# Patient Record
Sex: Female | Born: 1940 | Race: White | Hispanic: No | Smoking: Former smoker
Health system: Southern US, Community
[De-identification: ages and names within clinical notes are randomized; demographics above are authoritative.]

## PROBLEM LIST (undated history)

## (undated) DIAGNOSIS — E119 Type 2 diabetes mellitus without complications: Secondary | ICD-10-CM

## (undated) HISTORY — PX: CHOLECYSTECTOMY: SHX55

---

## 2017-11-06 ENCOUNTER — Other Ambulatory Visit: Payer: Self-pay

## 2017-11-06 ENCOUNTER — Emergency Department: Payer: Medicare (Managed Care)

## 2017-11-06 ENCOUNTER — Emergency Department
Admission: EM | Admit: 2017-11-06 | Discharge: 2017-11-06 | Disposition: A | Discharge status: Home / Self Care | Payer: Medicare (Managed Care) | Attending: Emergency Medicine | Admitting: Emergency Medicine

## 2017-11-06 ENCOUNTER — Encounter: Payer: Self-pay | Admitting: Emergency Medicine

## 2017-11-06 DIAGNOSIS — M25552 Pain in left hip: Secondary | ICD-10-CM | POA: Diagnosis not present

## 2017-11-06 DIAGNOSIS — I10 Essential (primary) hypertension: Secondary | ICD-10-CM | POA: Diagnosis not present

## 2017-11-06 DIAGNOSIS — Z87891 Personal history of nicotine dependence: Secondary | ICD-10-CM | POA: Diagnosis not present

## 2017-11-06 DIAGNOSIS — M25562 Pain in left knee: Secondary | ICD-10-CM | POA: Diagnosis not present

## 2017-11-06 DIAGNOSIS — E119 Type 2 diabetes mellitus without complications: Secondary | ICD-10-CM | POA: Insufficient documentation

## 2017-11-06 DIAGNOSIS — R2242 Localized swelling, mass and lump, left lower limb: Secondary | ICD-10-CM | POA: Diagnosis not present

## 2017-11-06 HISTORY — DX: Type 2 diabetes mellitus without complications: E11.9

## 2017-11-06 MED ORDER — KETOROLAC TROMETHAMINE 30 MG/ML IJ SOLN
30.0000 mg | Freq: Once | INTRAMUSCULAR | Status: DC
  Filled 2017-11-06: qty 1

## 2017-11-06 MED ORDER — LIDOCAINE 0.5 % EX GEL: 1.0000 "application " | Freq: Three times a day (TID) | CUTANEOUS | 0 refills | Status: AC

## 2017-11-06 MED ORDER — TRAMADOL HCL 50 MG PO TABS
50.0000 mg | ORAL_TABLET | Freq: Once | ORAL | Status: AC
  Administered 2017-11-06: 50 mg via ORAL
  Filled 2017-11-06: qty 1

## 2017-11-06 MED ORDER — TRAMADOL HCL 50 MG PO TABS: 50.0000 mg | ORAL_TABLET | Freq: Four times a day (QID) | ORAL | 0 refills | Status: AC | PRN

## 2017-11-06 NOTE — ED Notes (Signed)
Patient transported to Ultrasound 

## 2017-11-06 NOTE — ED Triage Notes (Signed)
Pt presents to ED c/o L hip pain shooting down to knee. Denies injury.

## 2017-11-06 NOTE — ED Provider Notes (Signed)
[facility] Emergency Department Provider Note  ____________________________________________  Time seen: Approximately 2:14 PM  I have reviewed the triage vital signs and the nursing notes.   HISTORY  Chief Complaint Hip Pain and Leg Pain    HPI Brianna Roberson is a 77 y.o. female that presents to the emergency department for evaluation of left hip and left knee pain worsening for 1 week. Knee pain is worse than hip. Pain is worse with walking.  No trauma.  Patient had 3-10 hour drives this week.  She drove from [place] to [place] up to [place] and back to [place]. Pain is worse when she is sitting in her daughter's "soft chair." She is taking a significant amount of Tylenol for pain.  She is unable to take NSAIDs due to GI bleeding.  No numbness, tingling.   Past Medical History:  Diagnosis Date  . Diabetes mellitus without complication (HCC)    type II    There are no active problems to display for this patient.   Past Surgical History:  Procedure Laterality Date  . CHOLECYSTECTOMY      Prior to Admission medications   Medication Sig Start Date End Date Taking? Authorizing Provider  Lidocaine 0.5 % GEL Apply 1 application topically 3 (three) times daily. 11/06/17   Enid Derry, PA-C  traMADol (ULTRAM) 50 MG tablet Take 1 tablet (50 mg total) by mouth every 6 (six) hours as needed. 11/06/17 11/06/18  Enid Derry, PA-C    Allergies Patient has no known allergies.  History reviewed. No pertinent family history.  Social History Social History   Tobacco Use  . Smoking status: Former Smoker    Types: Cigarettes    Last attempt to quit: 2009    Years since quitting: 10.3  . Smokeless tobacco: Never Used  Substance Use Topics  . Alcohol use: Yes    Comment: "socially"  . Drug use: Not on file     Review of Systems  Cardiovascular: No chest pain. Respiratory: No SOB. Gastrointestinal: No abdominal pain. No nausea, no  vomiting.  Musculoskeletal: Positive for hip and knee pain. Skin: Negative for rash, abrasions, lacerations, ecchymosis. Neurological: Negative for headaches, numbness or tingling   ____________________________________________   PHYSICAL EXAM:  VITAL SIGNS: ED Triage Vitals  Enc Vitals Group     BP 11/06/17 1356 (!) 194/52     Pulse Rate 11/06/17 1356 67     Resp 11/06/17 1356 18     Temp 11/06/17 1356 97.9 F (36.6 C)     Temp Source 11/06/17 1356 Oral     SpO2 11/06/17 1356 100 %     Weight 11/06/17 1356 186 lb (84.4 kg)     Height 11/06/17 1356  (1.6 m)     Head Circumference --      Peak Flow --      Pain Score 11/06/17 1359 8     Pain Loc --      Pain Edu? --      Excl. in GC? --      Constitutional: Alert and oriented. Well appearing and in no acute distress. Eyes: Conjunctivae are normal. PERRL. EOMI. Head: Atraumatic. ENT:      Ears:      Nose: No congestion/rhinnorhea.      Mouth/Throat: Mucous membranes are moist.  Neck: No stridor.   Cardiovascular: Normal rate, regular rhythm.  Good peripheral circulation. Respiratory: Normal respiratory effort without tachypnea or retractions. Lungs CTAB. Good air entry to  the bases with no decreased or absent breath sounds. Gastrointestinal: Bowel sounds 4 quadrants. Soft and nontender to palpation. No guarding or rigidity. No palpable masses. No distention. Musculoskeletal: Full range of motion to all extremities. No gross deformities appreciated. Antalgic gait. Neurologic:  Normal speech and language. No gross focal neurologic deficits are appreciated.  Skin:  Skin is warm, dry and intact. No rash noted. Psychiatric: Mood and affect are normal. Speech and behavior are normal. Patient exhibits appropriate insight and judgement.   ____________________________________________   LABS (all labs ordered are listed, but only abnormal results are displayed)  Labs Reviewed - No data to  display ____________________________________________  EKG   ____________________________________________  RADIOLOGY Lexine Baton, personally viewed and evaluated these images (plain radiographs) as part of my medical decision making, as well as reviewing the written report by the radiologist.  US Venous Img Lower Unilateral Left  Result Date: 11/06/2017 CLINICAL DATA:  Left lower extremity pain and edema. Recent travel. Evaluate for DVT. EXAM: LEFT LOWER EXTREMITY VENOUS DOPPLER ULTRASOUND TECHNIQUE: Gray-scale sonography with graded compression, as well as color Doppler and duplex ultrasound were performed to evaluate the lower extremity deep venous systems from the level of the common femoral vein and including the common femoral, femoral, profunda femoral, popliteal and calf veins including the posterior tibial, peroneal and gastrocnemius veins when visible. The superficial great saphenous vein was also interrogated. Spectral Doppler was utilized to evaluate flow at rest and with distal augmentation maneuvers in the common femoral, femoral and popliteal veins. COMPARISON:  None. FINDINGS: Contralateral Common Femoral Vein: Respiratory phasicity is normal and symmetric with the symptomatic side. No evidence of thrombus. Normal compressibility. Common Femoral Vein: No evidence of thrombus. Normal compressibility, respiratory phasicity and response to augmentation. Saphenofemoral Junction: No evidence of thrombus. Normal compressibility and flow on color Doppler imaging. Profunda Femoral Vein: No evidence of thrombus. Normal compressibility and flow on color Doppler imaging. Femoral Vein: No evidence of thrombus. Normal compressibility, respiratory phasicity and response to augmentation. Popliteal Vein: No evidence of thrombus. Normal compressibility, respiratory phasicity and response to augmentation. Calf Veins: No evidence of thrombus. Normal compressibility and flow on color Doppler imaging.  Superficial Great Saphenous Vein: No evidence of thrombus. Normal compressibility. Venous Reflux:  None. Other Findings:  None. IMPRESSION: No evidence of DVT within the left lower extremity. Electronically Signed   By: Simonne Come M.D.   On: 11/06/2017 15:28   Dg Knee Complete 4 Views Left  Result Date: 11/06/2017 CLINICAL DATA:  Left hip pain shooting down the knee. EXAM: LEFT KNEE - COMPLETE 4+ VIEW COMPARISON:  None. FINDINGS: No acute fracture or dislocation. No aggressive osseous lesion. 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 No significant joint effusion. Peripheral vascular atherosclerotic disease. Small well corticated area of ossification along the periphery of the medial femoral condyle likely reflecting sequela prior MCL injury. IMPRESSION: No acute osseous injury of the left knee. Electronically Signed   By: Elige Ko   On: 11/06/2017 14:55   Dg Hip Unilat W Or Wo Pelvis 2-3 Views Left  Result Date: 11/06/2017 CLINICAL DATA:  Left hip pain radiating to the knee. No known injury. EXAM: DG HIP (WITH OR WITHOUT PELVIS) 2-3V LEFT COMPARISON:  None. FINDINGS: No fracture or dislocation. Left hip joint spaces appear preserved. No evidence of avascular necrosis. Limited visualization of the pelvis suggests mild degenerate change of the pubic symphysis. Normal appearance of the contralateral right hip. Tiny ossicle  adjacent to the right greater trochanter likely represents the sequela prior avulsive injury. Radiopaque presumed pill fragments overlie the right lower abdominal quadrant and rectum. Vascular calcifications. IMPRESSION: No definite explanation for patient's radiating left hip pain. Electronically Signed   By: Simonne Come M.D.   On: 11/06/2017 14:56    ____________________________________________    PROCEDURES  Procedure(s) performed:    Procedures    Medications  traMADol (ULTRAM) tablet 50 mg (50 mg Oral Given  11/06/17 1656)     ____________________________________________   INITIAL IMPRESSION / ASSESSMENT AND PLAN / ED COURSE  Pertinent labs & imaging results that were available during my care of the patient were reviewed by me and considered in my medical decision making (see chart for details).  Review of the [place] CSRS was performed in accordance of the [facility] prior to dispensing any controlled drugs.   Patient presented to emergency department for evaluation of left hip and knee pain.  Ultrasound negative for DVT.  Hip and knee x-ray negative for acute abnormalities.  All x-ray findings were discussed with patient.  Blood pressure is high in the emergency department.  Pain is likely contributing. She is taking her blood pressure medication as prescribed. She saw her primary care last week and had blood work done on Thursday.  She will see her primary care provider next week when she returns to [place].  She is asymptomatic and denies headache, dizziness, visual changes, chest pain.  Patient will be discharged home with prescriptions for tramadol.  She will ice knee.  Patient is to follow up with primary care as directed. Patient is given ED precautions to return to the ED for any worsening or new symptoms.     ____________________________________________  FINAL CLINICAL IMPRESSION(S) / ED DIAGNOSES  Final diagnoses:  Acute pain of left knee  Hypertension, unspecified type      NEW MEDICATIONS STARTED DURING THIS VISIT:  ED Discharge Orders        Ordered    traMADol (ULTRAM) 50 MG tablet  Every 6 hours PRN     11/06/17 1638    Lidocaine 0.5 % GEL  3 times daily     11/06/17 1638    Walker standard     11/06/17 1638          This chart was dictated using voice recognition software/Dragon. Despite best efforts to proofread, errors can occur which can change the meaning. Any change was purely unintentional.    Enid Derry, PA-C 11/07/17 1648    Arnaldo Natal,  MD 11/08/17 1538

## 2017-11-06 NOTE — Discharge Instructions (Signed)
Your blood pressure was high while in ED.  Please continue blood pressure medication.  Follow-up with primary care as soon as possible.  Return to the emergency department immediately for headache, dizziness, visual changes, chest pain or any other concerning symptoms.

## 2019-06-29 IMAGING — US US EXTREM LOW VENOUS*L*
1 series · 13 of 24 positions shown · non-contrast
Comparison: None.

CLINICAL DATA: Left lower extremity pain and edema. Recent travel.
Evaluate for DVT.



[Series 1: us extrem low venous*left* · 0.08mm/px · 13 of 34 slices shown]
[im 1/34]
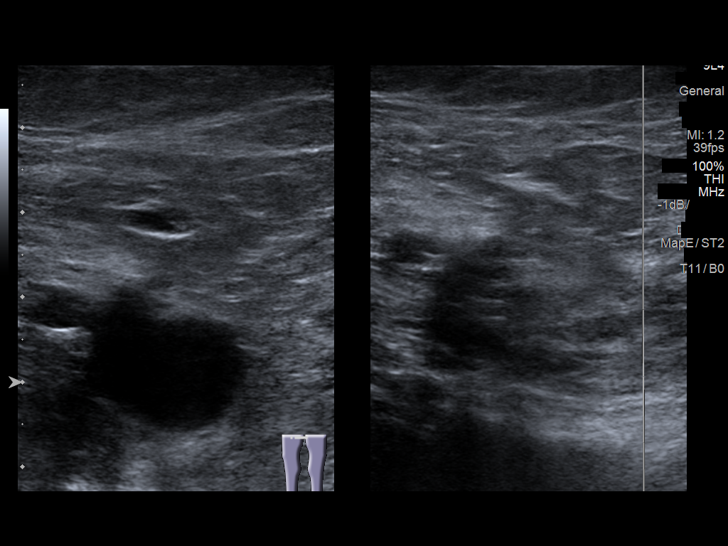
[im 3/34]
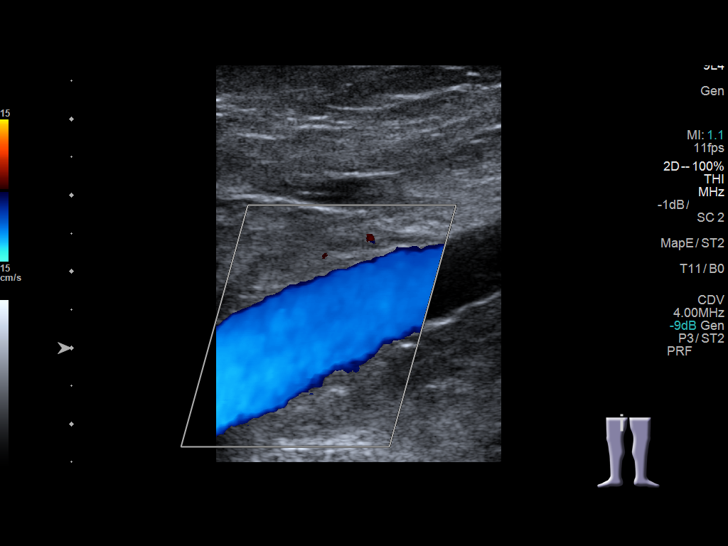
[im 6/34]
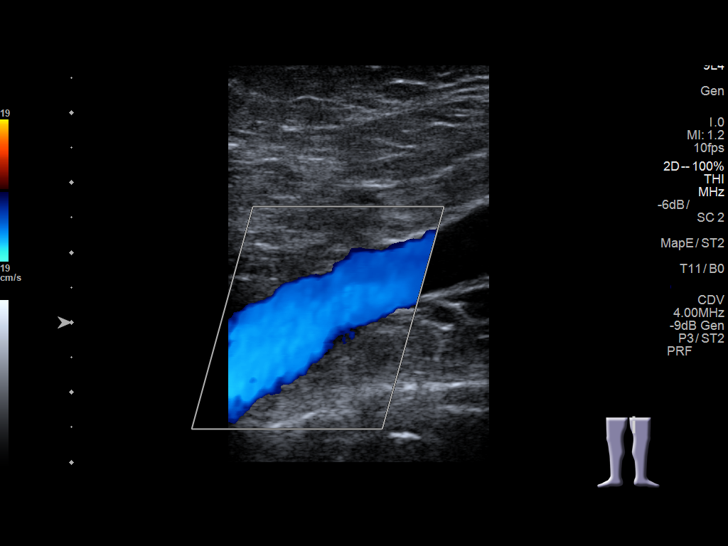
[im 9/34]
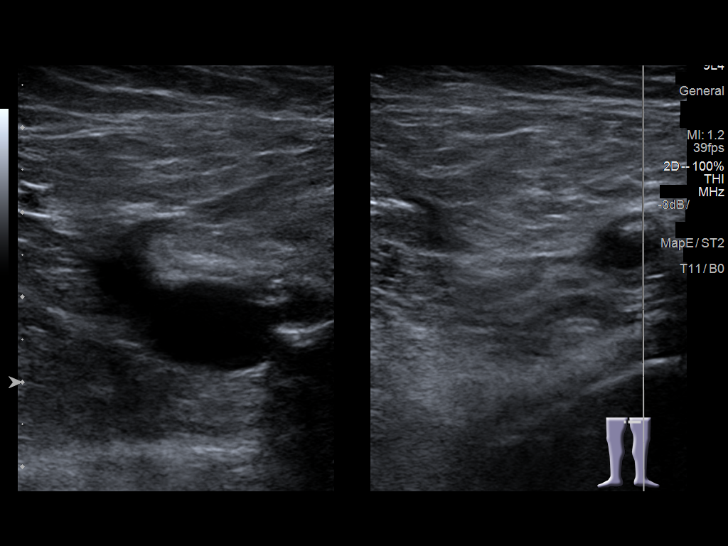
[im 12/34]
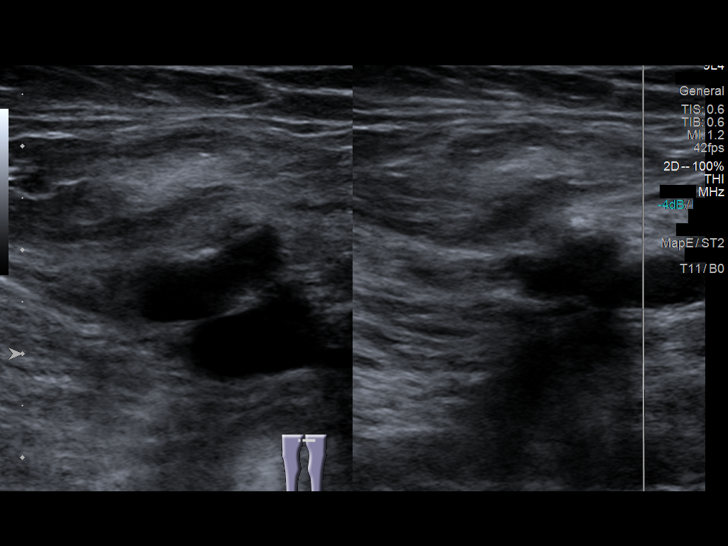
[im 15/34]
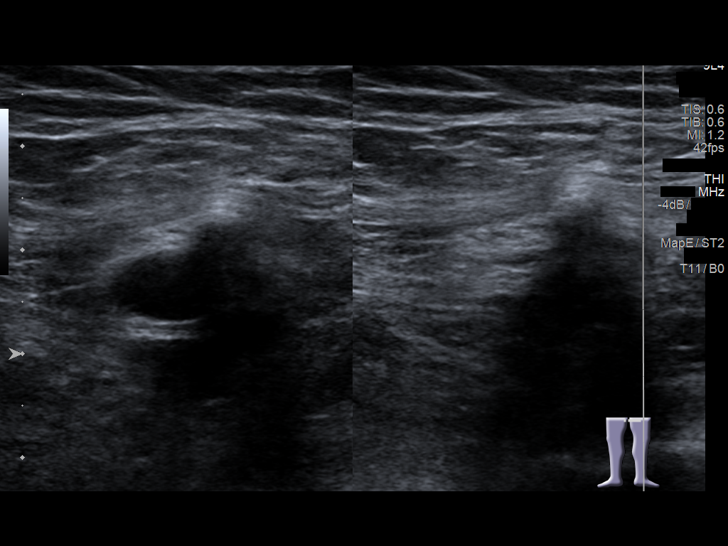
[im 18/34]
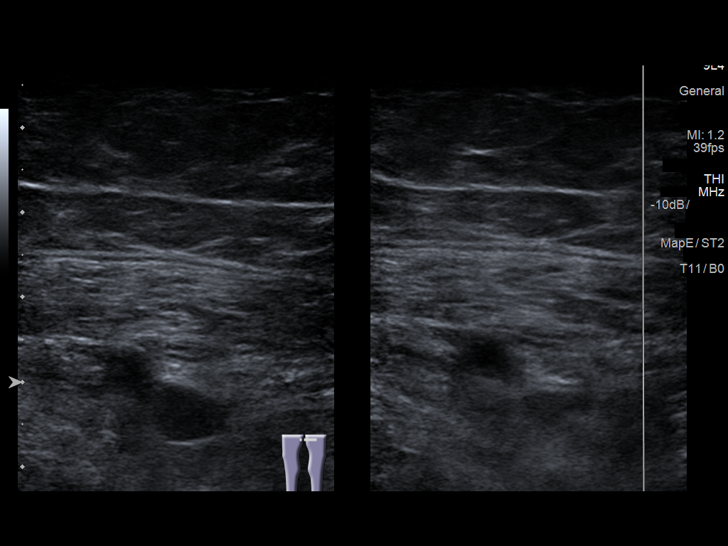
[im 19/34]
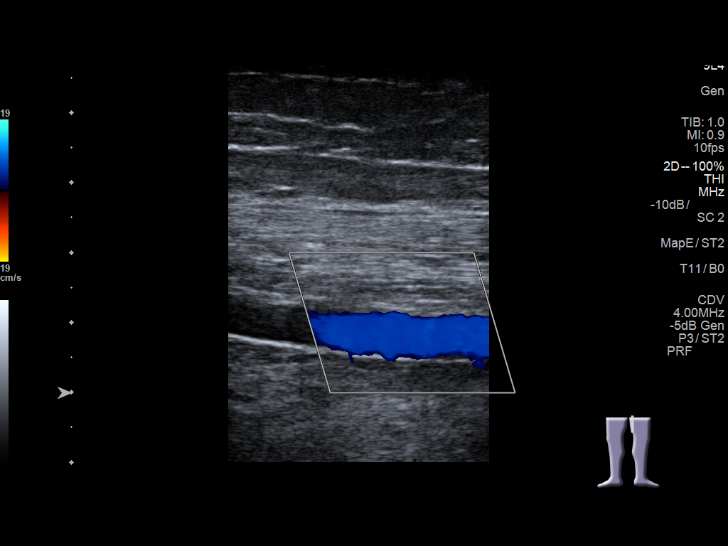
[im 22/34]
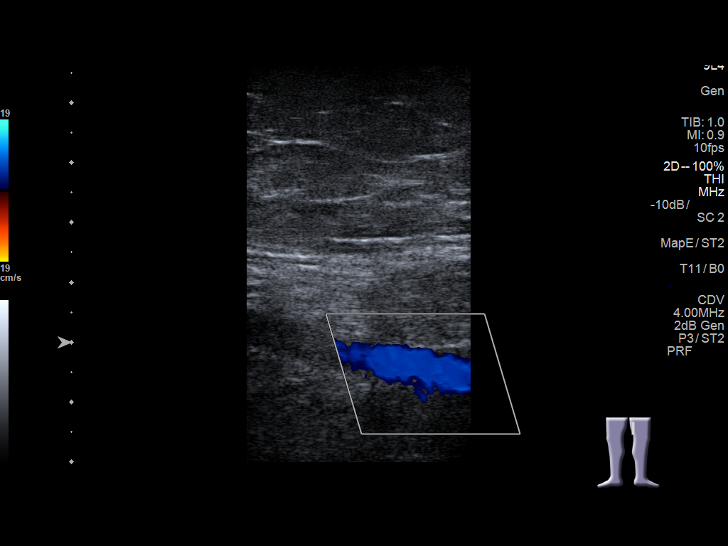
[im 25/34]
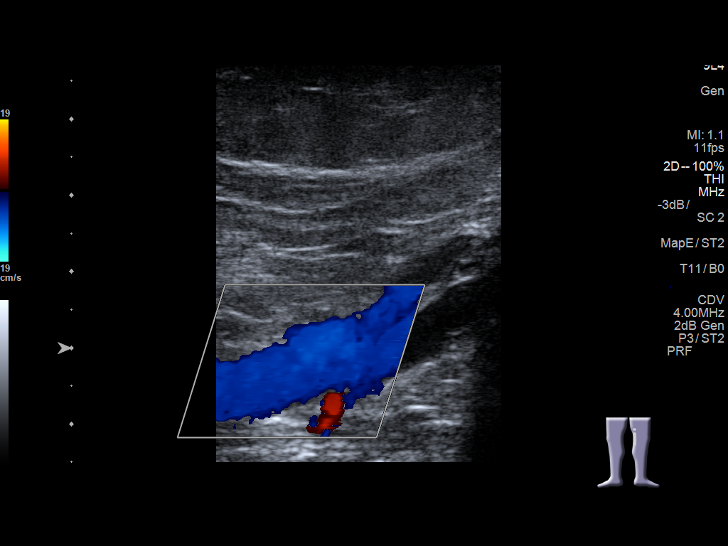
[im 28/34]
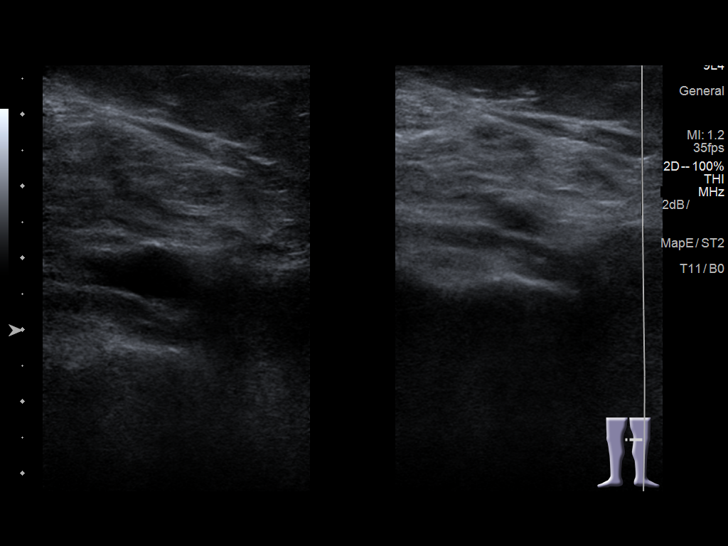
[im 31/34]
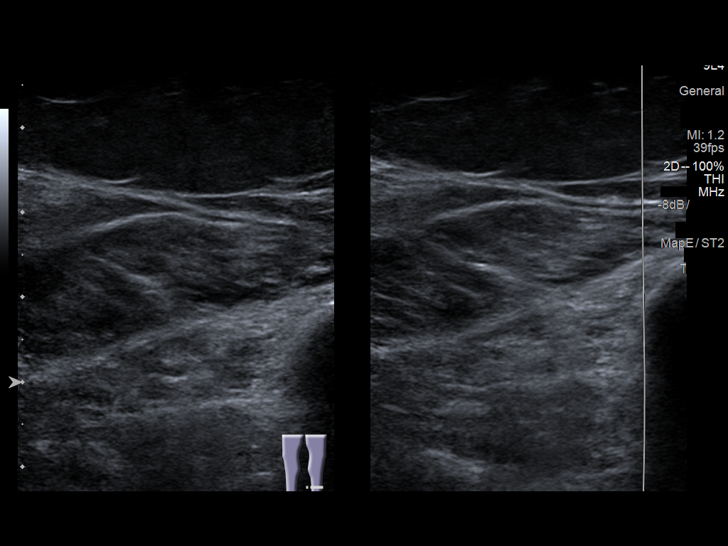
[im 34/34]
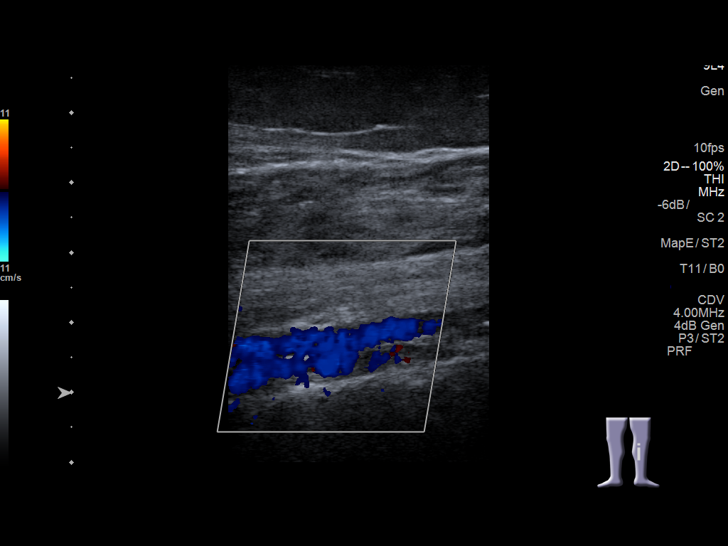

[13 of 24 positions shown; findings below may reference images not displayed]

FINDINGS: Contralateral Common Femoral Vein: Respiratory phasicity is normal
and symmetric with the symptomatic side. No evidence of thrombus.
Normal compressibility.

Common Femoral Vein: No evidence of thrombus. Normal
compressibility, respiratory phasicity and response to augmentation.

Saphenofemoral Junction: No evidence of thrombus. Normal
compressibility and flow on color Doppler imaging.

Profunda Femoral Vein: No evidence of thrombus. Normal
compressibility and flow on color Doppler imaging.

Femoral Vein: No evidence of thrombus. Normal compressibility,
respiratory phasicity and response to augmentation.

Popliteal Vein: No evidence of thrombus. Normal compressibility,
respiratory phasicity and response to augmentation.

Calf Veins: No evidence of thrombus. Normal compressibility and flow
on color Doppler imaging.

Superficial Great Saphenous Vein: No evidence of thrombus. Normal
compressibility.

Venous Reflux:  None.

Other Findings:  None.
IMPRESSION: No evidence of DVT within the left lower extremity.
